# Patient Record
Sex: Male | Born: 1995 | Race: White | Hispanic: No | Marital: Single | Smoking: Never smoker
Health system: Southern US, Community
[De-identification: ages and names within clinical notes are randomized; demographics above are authoritative.]

## PROBLEM LIST (undated history)

## (undated) HISTORY — PX: WISDOM TOOTH EXTRACTION: SHX21

---

## 2017-12-12 ENCOUNTER — Emergency Department (HOSPITAL_COMMUNITY): Payer: 59

## 2017-12-12 ENCOUNTER — Encounter (HOSPITAL_COMMUNITY): Payer: Self-pay | Admitting: Emergency Medicine

## 2017-12-12 ENCOUNTER — Emergency Department (HOSPITAL_COMMUNITY)
Admission: EM | Admit: 2017-12-12 | Discharge: 2017-12-12 | Disposition: A | Discharge status: Home / Self Care | Payer: 59 | Attending: Emergency Medicine | Admitting: Emergency Medicine

## 2017-12-12 ENCOUNTER — Other Ambulatory Visit: Payer: Self-pay

## 2017-12-12 DIAGNOSIS — S0181XA Laceration without foreign body of other part of head, initial encounter: Secondary | ICD-10-CM | POA: Insufficient documentation

## 2017-12-12 DIAGNOSIS — S0990XA Unspecified injury of head, initial encounter: Secondary | ICD-10-CM | POA: Diagnosis not present

## 2017-12-12 DIAGNOSIS — Y999 Unspecified external cause status: Secondary | ICD-10-CM | POA: Diagnosis not present

## 2017-12-12 DIAGNOSIS — Y92012 Bathroom of single-family (private) house as the place of occurrence of the external cause: Secondary | ICD-10-CM | POA: Diagnosis not present

## 2017-12-12 DIAGNOSIS — Y939 Activity, unspecified: Secondary | ICD-10-CM | POA: Insufficient documentation

## 2017-12-12 DIAGNOSIS — R55 Syncope and collapse: Secondary | ICD-10-CM

## 2017-12-12 DIAGNOSIS — W1830XA Fall on same level, unspecified, initial encounter: Secondary | ICD-10-CM | POA: Insufficient documentation

## 2017-12-12 LAB — CBC WITH DIFFERENTIAL/PLATELET
Abs Immature Granulocytes: 0.04 10*3/uL (ref 0.00–0.07)
Basophils Absolute: 0 10*3/uL (ref 0.0–0.1)
Basophils Relative: 0 %
Eosinophils Absolute: 0.1 10*3/uL (ref 0.0–0.5)
Eosinophils Relative: 1 %
HCT: 43.3 % (ref 39.0–52.0)
HEMOGLOBIN: 14.2 g/dL (ref 13.0–17.0)
IMMATURE GRANULOCYTES: 1 %
LYMPHS PCT: 26 %
Lymphs Abs: 2 10*3/uL (ref 0.7–4.0)
MCH: 31.1 pg (ref 26.0–34.0)
MCHC: 32.8 g/dL (ref 30.0–36.0)
MCV: 95 fL (ref 80.0–100.0)
MONO ABS: 0.6 10*3/uL (ref 0.1–1.0)
Monocytes Relative: 7 %
NEUTROS ABS: 5.2 10*3/uL (ref 1.7–7.7)
Neutrophils Relative %: 65 %
Platelets: 251 10*3/uL (ref 150–400)
RBC: 4.56 MIL/uL (ref 4.22–5.81)
RDW: 11.5 % (ref 11.5–15.5)
WBC: 7.9 10*3/uL (ref 4.0–10.5)
nRBC: 0 % (ref 0.0–0.2)

## 2017-12-12 LAB — COMPREHENSIVE METABOLIC PANEL
ALBUMIN: 4.3 g/dL (ref 3.5–5.0)
ALT: 15 U/L (ref 0–44)
AST: 17 U/L (ref 15–41)
Alkaline Phosphatase: 42 U/L (ref 38–126)
Anion gap: 9 (ref 5–15)
BUN: 15 mg/dL (ref 6–20)
CO2: 26 mmol/L (ref 22–32)
Calcium: 9.1 mg/dL (ref 8.9–10.3)
Chloride: 108 mmol/L (ref 98–111)
Creatinine, Ser: 0.89 mg/dL (ref 0.61–1.24)
GFR calc Af Amer: >60 mL/min (ref >60)
GLUCOSE: 102 mg/dL — AB (ref 70–99)
POTASSIUM: 4.4 mmol/L (ref 3.5–5.1)
Sodium: 143 mmol/L (ref 135–145)
Total Bilirubin: 0.4 mg/dL (ref 0.3–1.2)
Total Protein: 6.7 g/dL (ref 6.5–8.1)

## 2017-12-12 LAB — RAPID URINE DRUG SCREEN, HOSP PERFORMED
AMPHETAMINES: NOT DETECTED
BENZODIAZEPINES: NOT DETECTED
Barbiturates: NOT DETECTED
COCAINE: NOT DETECTED
OPIATES: NOT DETECTED
TETRAHYDROCANNABINOL: NOT DETECTED

## 2017-12-12 LAB — ETHANOL: Alcohol, Ethyl (B): 46 mg/dL — ABNORMAL HIGH (ref <10)

## 2017-12-12 MED ORDER — SODIUM CHLORIDE 0.9 % IV BOLUS
1000.0000 mL | Freq: Once | INTRAVENOUS | Status: AC
  Administered 2017-12-12: 1000 mL via INTRAVENOUS

## 2017-12-12 MED ORDER — ONDANSETRON HCL 4 MG/2ML IJ SOLN
4.0000 mg | Freq: Once | INTRAMUSCULAR | Status: AC
  Administered 2017-12-12: 4 mg via INTRAVENOUS
  Filled 2017-12-12: qty 2

## 2017-12-12 MED ORDER — LIDOCAINE-EPINEPHRINE 2 %-1:100000 IJ SOLN
10.0000 mL | Freq: Once | INTRAMUSCULAR | Status: AC
  Administered 2017-12-12: 10 mL via INTRADERMAL
  Filled 2017-12-12: qty 10

## 2017-12-12 NOTE — Discharge Instructions (Addendum)
Recommend wound check in 2 days with your doctor. Suture removal in 3-5 days. Return to ER for any worsening or concerning symptoms. Referral to Cardiology for follow up possible syncopal episode versus vasovagal reaction.  Take Motrin or Tylenol as needed for pain. Avoid alcohol until wound is well healed.

## 2017-12-12 NOTE — ED Notes (Signed)
signature failed to capture

## 2017-12-12 NOTE — ED Triage Notes (Signed)
Pt presents with a chin laceration. Patient states that he remembers going to bed and the last thing he remembers is waking up on his bathroom floor bleeding. Patient does not remember waking up or walking to the bathroom. Patient states dad has had a similar incident.

## 2017-12-12 NOTE — ED Notes (Signed)
Patient made aware of need for urine sample. Patient unable to provide at this time.  

## 2017-12-12 NOTE — ED Notes (Signed)
ED Provider at bedside. 

## 2017-12-12 NOTE — Progress Notes (Signed)
Called regarding otherwise healthy 22 year old male patient who had alcohol use last evening.  Overnight there was a fall and chin laceration.  He does not recall the event.  He presented for wound evaluation and per the ER provider had been anesthetized and had an episode of bradycardia and presyncope.  This resolved rather quickly.  Both episodes sound like they may be vasovagal syncope.  This is much more likely in a young patient with probable degree of dehydration in the setting of alcohol use and noxious stimuli with sutures.  There also may be family history of this in his father who also had an unexplained syncopal episode.  Labs are normal except for elevated alcohol level.  Chest x-ray is unremarkable.  EKG shows lead reversal with isolated T wave inversion in lead III.  Normal axis and intervals-personally reviewed. Would recommend alcohol avoidance, hydration and follow-up with cardiology as needed if further episodes occur.  Chrystie NoseKenneth C. Kirstyn Lean, MD, [facility] Surgical CenterFACC, FACP  [place]  [facility]  Medical Director of the [facility] Diplomate of the American Board of Clinical Lipidology Attending Cardiologist  Direct Dial: [phone]  Fax: [phone]  Website:  [email or web address]

## 2017-12-12 NOTE — ED Notes (Signed)
PT STATED HE CANT VOID

## 2017-12-12 NOTE — ED Provider Notes (Signed)
[facility]-EMERGENCY DEPT Provider Note   CSN: [id number] Arrival date & time: 12/12/17  0865     History   Chief Complaint Chief Complaint  Patient presents with  . Laceration  . Fall    HPI Jarrius Huaracha is a 22 y.o. male.  22yo male presents with laceration to his chin. Patient states he vaguely remembers walking to the bathroom sometime last night, does not recall falling, woke up at 4:30AM on the floor of his bathroom with blood on his hands and noticed his chin bleeding. Patient has been ambulatory without assistance, was able to drive himself to the ER, denies headaches, nausea, vomiting, changes in vision, mouth injury or jaw pain. Reports drinking last night, states he was able to walk home. No other injuries, complaints, concerns. Last td less than 5 years ago.     History reviewed. No pertinent past medical history.  There are no active problems to display for this patient.   Past Surgical History:  Procedure Laterality Date  . WISDOM TOOTH EXTRACTION          Home Medications    Prior to Admission medications   Not on File    Family History No family history on file.  Social History Social History   Tobacco Use  . Smoking status: Never Smoker  . Smokeless tobacco: Never Used  Substance Use Topics  . Alcohol use: Yes    Comment: Social  . Drug use: Never     Allergies   Patient has no known allergies.   Review of Systems Review of Systems  Constitutional: Negative for fever.  HENT: Negative for dental problem and nosebleeds.   Eyes: Negative for visual disturbance.  Gastrointestinal: Negative for nausea and vomiting.  Musculoskeletal: Negative for arthralgias, back pain, gait problem, myalgias and neck pain.  Skin: Positive for wound.  Allergic/Immunologic: Negative for immunocompromised state.  Neurological: Negative for dizziness, speech difficulty, weakness and headaches.  Hematological: Does not  bruise/bleed easily.  Psychiatric/Behavioral: Negative for confusion.  All other systems reviewed and are negative.    Physical Exam Updated Vital Signs BP 132/63 (BP Location: Right Arm)   Pulse (!) 53   Temp 99 F (37.2 C) (Oral)   Resp 20   Ht 6' (1.829 m)   Wt 81.6 kg   SpO2 98%   BMI 24.41 kg/m   Physical Exam  Constitutional: He is oriented to person, place, and time. He appears well-developed and well-nourished. No distress.  HENT:  Head: Normocephalic. Head is without raccoon's eyes and without Battle's sign.    Nose: Nose normal.  Mouth/Throat: Uvula is midline and oropharynx is clear and moist. Normal dentition. No lacerations. No oropharyngeal exudate.  Eyes: Pupils are equal, round, and reactive to light. Conjunctivae and EOM are normal.  Neck: Normal range of motion. Neck supple.  Cardiovascular: Normal rate, regular rhythm, normal heart sounds and intact distal pulses.  No murmur heard. Pulmonary/Chest: Effort normal and breath sounds normal. No respiratory distress.  Musculoskeletal: He exhibits no tenderness or deformity.  Neurological: He is alert and oriented to person, place, and time. He is not disoriented. No cranial nerve deficit or sensory deficit. He exhibits normal muscle tone. Coordination normal. GCS eye subscore is 4. GCS verbal subscore is 5. GCS motor subscore is 6.  Skin: Skin is warm and dry. He is not diaphoretic.  Psychiatric: He has a normal mood and affect. His behavior is normal.  Nursing note and vitals reviewed.  ED Treatments / Results  Labs (all labs ordered are listed, but only abnormal results are displayed) Labs Reviewed  COMPREHENSIVE METABOLIC PANEL - Abnormal; Notable for the following components:      Result Value   Glucose, Bld 102 (*)    All other components within normal limits  ETHANOL - Abnormal; Notable for the following components:   Alcohol, Ethyl (B) 46 (*)    All other components within normal limits  CBC  WITH DIFFERENTIAL/PLATELET  RAPID URINE DRUG SCREEN, HOSP PERFORMED    EKG None  Radiology Ct Head Wo Contrast  Result Date: 12/12/2017 CLINICAL DATA:  23 year old male with acute headache following head injury. Initial encounter. EXAM: CT HEAD WITHOUT CONTRAST TECHNIQUE: Contiguous axial images were obtained from the base of the skull through the vertex without intravenous contrast. COMPARISON:  None. FINDINGS: Brain: No evidence of acute infarction, hemorrhage, hydrocephalus, extra-axial collection or mass lesion/mass effect. Cavum septum pellucidum noted. Vascular: No hyperdense vessel or unexpected calcification. Skull: Normal. Negative for fracture or focal lesion. Sinuses/Orbits: No acute finding. Other: None. IMPRESSION: No evidence of acute intracranial abnormality. Cavum septum pellucidum Electronically Signed   By: Harmon Pier M.D.   On: 12/12/2017 09:03   Ct Cervical Spine Wo Contrast  Result Date: 12/12/2017 CLINICAL DATA:  Fall, syncope, trauma EXAM: CT CERVICAL SPINE WITHOUT CONTRAST TECHNIQUE: Multidetector CT imaging of the cervical spine was performed without intravenous contrast. Multiplanar CT image reconstructions were also generated. COMPARISON:  None. FINDINGS: Alignment: Straightened alignment may be positional. No subluxation dislocation. Skull base and vertebrae: No acute fracture. No primary bone lesion or focal pathologic process. Soft tissues and spinal canal: No prevertebral fluid or swelling. No visible canal hematoma. Disc levels: Preserved vertebral body heights and disc spaces. No significant degenerative process. Facets aligned. No subluxation dislocation. Intact odontoid. Upper chest: Negative. Other: None. IMPRESSION: Straightened cervical spine alignment may be positional. No acute osseous finding, fracture, or malalignment by CT. Electronically Signed   By: Judie Petit.  Shick M.D.   On: 12/12/2017 09:50   Dg Chest Port 1 View  Result Date: 12/12/2017 CLINICAL DATA:   Syncope EXAM: PORTABLE CHEST 1 VIEW COMPARISON:  None. FINDINGS: The heart size and mediastinal contours are within normal limits. Both lungs are clear. The visualized skeletal structures are unremarkable. IMPRESSION: No active disease. Electronically Signed   By: Judie Petit.  Shick M.D.   On: 12/12/2017 09:13    Procedures .Marland KitchenLaceration Repair Date/Time: 12/12/2017 6:56 AM Performed by: Jeannie Fend, PA-C Authorized by: Jeannie Fend, PA-C   Consent:    Consent obtained:  Verbal   Consent given by:  Patient   Risks discussed:  Infection, need for additional repair, pain, poor cosmetic result and poor wound healing   Alternatives discussed:  No treatment and delayed treatment Universal protocol:    Procedure explained and questions answered to patient or proxy's satisfaction: yes     Relevant documents present and verified: yes     Immediately prior to procedure, a time out was called: yes     Patient identity confirmed:  Verbally with patient Anesthesia (see MAR for exact dosages):    Anesthesia method:  Local infiltration   Local anesthetic:  Lidocaine 2% WITH epi Laceration details:    Location:  Face   Face location:  Chin   Length (cm):  2.5   Depth (mm):  4 Repair type:    Repair type:  Simple Pre-procedure details:    Preparation:  Patient was prepped and draped in  usual sterile fashion Exploration:    Hemostasis achieved with:  Epinephrine   Wound exploration: wound explored through full range of motion and entire depth of wound probed and visualized     Wound extent: no foreign bodies/material noted and no muscle damage noted     Contaminated: no   Treatment:    Area cleansed with:  Saline   Amount of cleaning:  Standard   Irrigation solution:  Sterile saline Skin repair:    Repair method:  Sutures   Suture size:  5-0   Suture material:  Prolene   Suture technique:  Simple interrupted   Number of sutures:  4 Approximation:    Approximation:  Close Post-procedure  details:    Dressing:  Open (no dressing)   Patient tolerance of procedure:  Tolerated well, no immediate complications   (including critical care time)  Medications Ordered in ED Medications  lidocaine-EPINEPHrine (XYLOCAINE W/EPI) 2 %-1:100000 (with pres) injection 10 mL (10 mLs Intradermal Given by Other 12/12/17 0716)  ondansetron (ZOFRAN) injection 4 mg (4 mg Intravenous Given 12/12/17 0830)  sodium chloride 0.9 % bolus 1,000 mL (0 mLs Intravenous Stopped 12/12/17 1029)     Initial Impression / Assessment and Plan / ED Course  I have reviewed the triage vital signs and the nursing notes.  Pertinent labs & imaging results that were available during my care of the patient were reviewed by me and considered in my medical decision making (see chart for details).  Clinical Course as of Dec 13 1346  Sun Dec 12, 2017  [id number] 22 year old male presents with laceration to his chin.  States that he woke up at around times a night to go to the bathroom and the next thing he remembers is waking up on the floor with blood on his hands and noticing he had a cut to his chin which was still bleeding. On initial exam, denied headache, nausea/vomiting, states he was otherwise feeling well and drove himself to the Er. Laceration to the chin, no neck pain pain, no other head trauma, no other injuries. Returned to room for laceration repair, patient stated he was not feeling well, appeared slightly flushed, placed in the supine position for lac repair. Anaesthetized with lido with epi, patient reported feeling nauseous, HR noted to be in the 30s, assisted to seated position and patient vomited with increase in HR to the 90s. EKG, CT head/neck, labs ordered for further evaluation for concern for syncope vs arrhythmia vs vasovagal reaction vs intracranial injury. Case discussed with Dr. Freida BusmanAllen, ER attending. Imaging normal, labs with elevated alchol at 46 (5 hours after patient reported waking on the bathroom floor  at 4:30AM). No cardiac history, states his father had an episode about 10 years ago when he passed out in the bathroom and cut his lip, no known cardiac diagnosis after that event.  Case discussed with Dr. Rennis GoldenHilty with cardiology, likely vasovagal episode and does  not require further monitoring or admission. Dr. Freida BusmanAllen has seen the patient, no further symptoms while in the Er, patient does not want to be admitted at this time. Patient referred to cardiology for followup with strict return to ER precautions given.    [LM]    Clinical Course User Index [LM] Jeannie FendMurphy, Olvin Rohr A, PA-C   Final Clinical Impressions(s) / ED Diagnoses   Final diagnoses:  Chin laceration, initial encounter  Injury of head, initial encounter  Vasovagal episode    ED Discharge Orders    None  Jeannie Fend, PA-C 12/12/17 1348    Lorre Nick, MD 12/13/17 [phone]

## 2019-05-29 ENCOUNTER — Other Ambulatory Visit: Payer: Self-pay

## 2019-05-29 MED ORDER — MELOXICAM 15 MG PO TABS: ORAL_TABLET | ORAL | 1 refills | Status: AC

## 2019-05-29 NOTE — Progress Notes (Signed)
Submitted mobic 15mg  1 po daily #30 with 1 rf per Dr request to Alfonso Patten

## 2019-09-19 ENCOUNTER — Ambulatory Visit (INDEPENDENT_AMBULATORY_CARE_PROVIDER_SITE_OTHER): Payer: Self-pay | Admitting: Orthopedic Surgery

## 2019-09-19 DIAGNOSIS — M25562 Pain in left knee: Secondary | ICD-10-CM

## 2019-09-27 ENCOUNTER — Other Ambulatory Visit: Payer: Self-pay

## 2019-09-30 ENCOUNTER — Encounter: Payer: Self-pay | Admitting: Orthopedic Surgery

## 2019-09-30 NOTE — Progress Notes (Signed)
° °  Post-Op Visit Note   Patient: Louis Marshall           Date of Birth: 04-Oct-1995           MRN: [id number] Visit Date: 09/19/2019 PCP: System, Pcp Not In   Assessment & Plan:  Chief Complaint: No chief complaint on file.  Visit Diagnoses:  1. Acute pain of left knee     Plan: Louis Marshall is a patient with left knee pain.  He plays baseball.  Tweaked his knee the summer.  Hard for him to go up and down stairs and it is hard for him to run.  Localizes the pain in the patellar tendon region.  He has tried naproxen and stretching modalities as well as dry needling.  On exam he has focal tenderness over the left patellar tendon right at the inferior pole.  No effusion.  Collateral and cruciate ligaments are stable.  Extensor mechanism is intact.  Pedal pulses palpable.  Fluoroscopic x-ray of the knee unremarkable and negative for fracture or arthritis.  Impression is patellar tendinitis.  We will try him on a Medrol Dosepak as well as specialized iontophoresis with Louis Marshall.  See him back as needed.  Follow-Up Instructions: Return if symptoms worsen or fail to improve.   Orders:  No orders of the defined types were placed in this encounter.  No orders of the defined types were placed in this encounter.   Imaging: No results found.  PMFS History: There are no problems to display for this patient.  History reviewed. No pertinent past medical history.  History reviewed. No pertinent family history.  Past Surgical History:  Procedure Laterality Date   WISDOM TOOTH EXTRACTION     Social History   Occupational History   Not on file  Tobacco Use   Smoking status: Never Smoker   Smokeless tobacco: Never Used  Substance and Sexual Activity   Alcohol use: Yes    Comment: Social   Drug use: Never   Sexual activity: Not on file

## 2020-02-05 ENCOUNTER — Ambulatory Visit: Payer: Self-pay

## 2020-03-25 ENCOUNTER — Ambulatory Visit (INDEPENDENT_AMBULATORY_CARE_PROVIDER_SITE_OTHER): Payer: Self-pay | Admitting: Orthopedic Surgery

## 2020-03-25 DIAGNOSIS — M25511 Pain in right shoulder: Secondary | ICD-10-CM

## 2020-03-27 ENCOUNTER — Encounter: Payer: Self-pay | Admitting: Orthopedic Surgery

## 2020-03-27 NOTE — Progress Notes (Signed)
   Post-Op Visit Note   Patient: Louis Marshall           Date of Birth: 06-26-1995           MRN: [id number] Visit Date: 03/25/2020 PCP: Pcp, No   Assessment & Plan:  Chief Complaint: No chief complaint on file.  Visit Diagnoses:  1. Acute pain of right shoulder     Plan: Louis Marshall is a 24 year old patient with right shoulder pain which she has had for "a while".  Reports posterior pain.  This fades away over time.  He is able to pitch for 5 endings and then the shoulder gets tight and it is hard for him to keep up his velocity.  He did some rehabilitation starting winter break.  Takes Aleve for his symptoms.  Hurts for him to turn the wheel.  His pain is most severe in the release and follow through phase of throwing.  Denies any new mechanical symptoms.  Since onset of symptoms his throwing velocity has decreased from the 89-91 range down to the 87-88 range in the middle of the game and by the end of the game 84-85.  Denies any neck pain or numbness and tingling.  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  He states that his problem feels more muscular in nature.  On exam his got full active and passive range of motion of the shoulder.  Motor sensory function is intact in the hand.  Negative apprehension relocation testing.  Equivocal O'Brien's testing on the right negative on the left.  No discrete AC joint tenderness is present.  Impression is right shoulder pain which could be rotator cuff tendinitis/overuse versus labral pathology.  Neck step would be Medrol Dosepak and consideration of injection.  He may need further work-up at the end of the season as well with MRI arthrogram if mechanical symptoms began.  Follow-Up Instructions: Return if symptoms worsen or fail to improve.   Orders:  No orders of the defined types were placed in this encounter.  No orders of the defined types were placed in this encounter.   Imaging: No results found.  PMFS History: There are  no problems to display for this patient.  History reviewed. No pertinent past medical history.  History reviewed. No pertinent family history.  Past Surgical History:  Procedure Laterality Date  . WISDOM TOOTH EXTRACTION     Social History   Occupational History  . Not on file  Tobacco Use  . Smoking status: Never Smoker  . Smokeless tobacco: Never Used  Substance and Sexual Activity  . Alcohol use: Yes    Comment: Social  . Drug use: Never  . Sexual activity: Not on file

## 2020-06-12 IMAGING — CT CT HEAD W/O CM
3 series · 14 of 47 positions shown, 16 images · non-contrast
Comparison: None.

CLINICAL DATA: 22-year-old male with acute headache following head
injury. Initial encounter.

EXAM:
CT HEAD WITHOUT CONTRAST
TECHNIQUE: Contiguous axial images were obtained from the base of the skull
through the vertex without intravenous contrast.

[Series 2: head wo · axial · 0.47mm/px · z∈[-112,+13]mm · 8 of 31 slices shown, 10 images]
[im 3/31  brain]
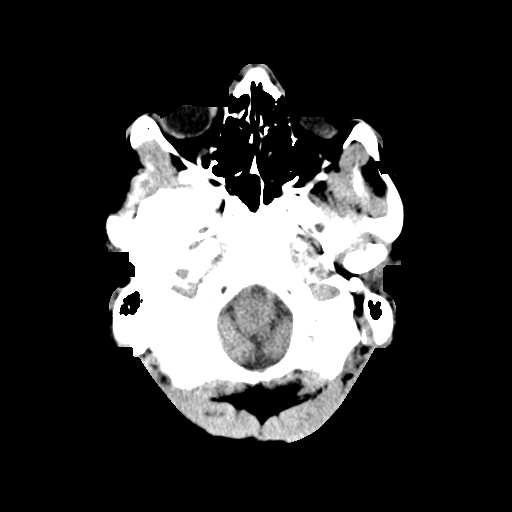
[im 3/31  bone]
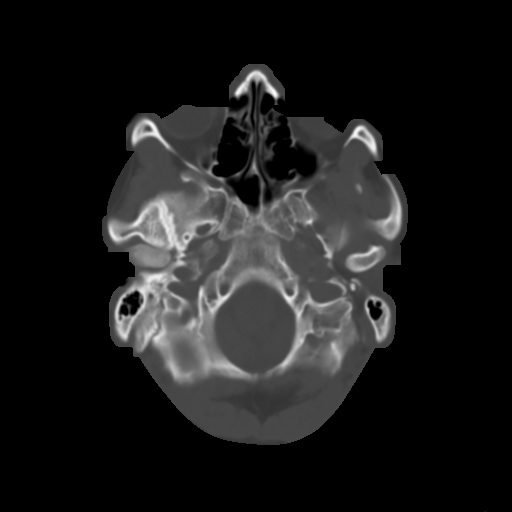
[im 7/31  brain]
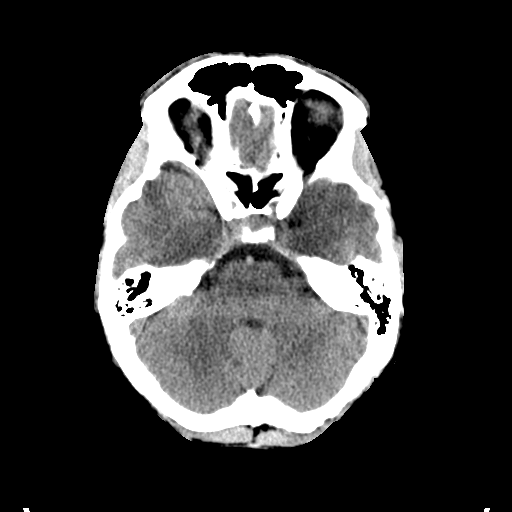
[im 10/31  brain]
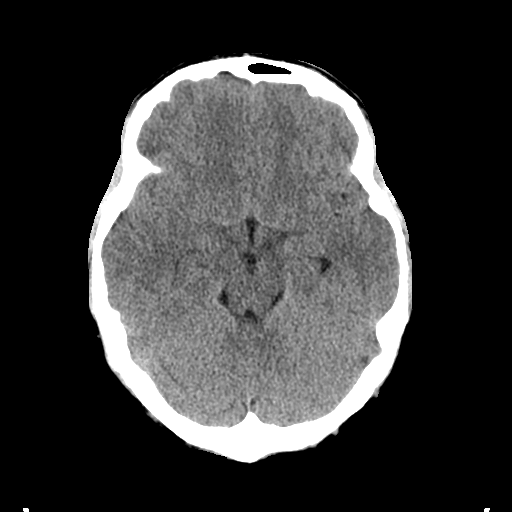
[im 14/31  brain]
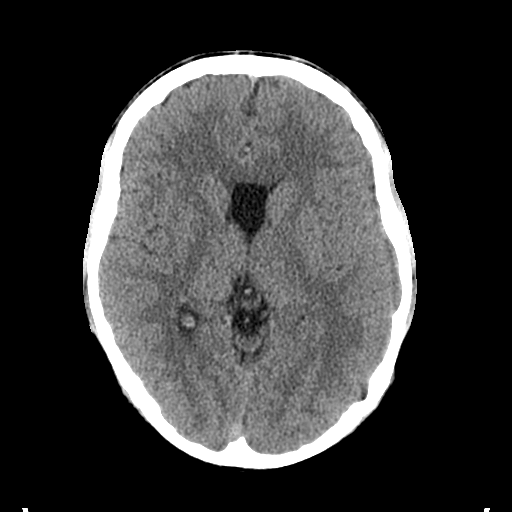
[im 17/31  brain]
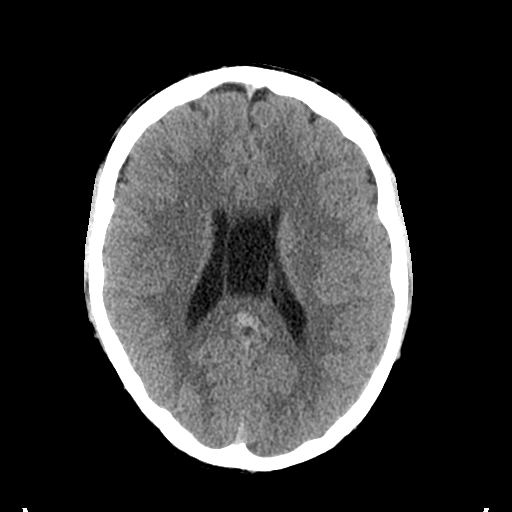
[im 17/31  bone]
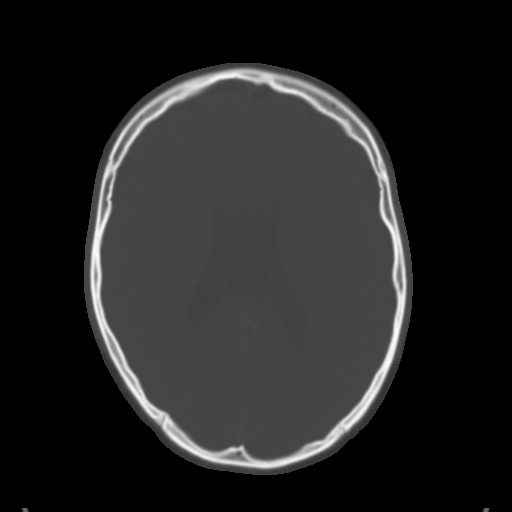
[im 21/31  brain]
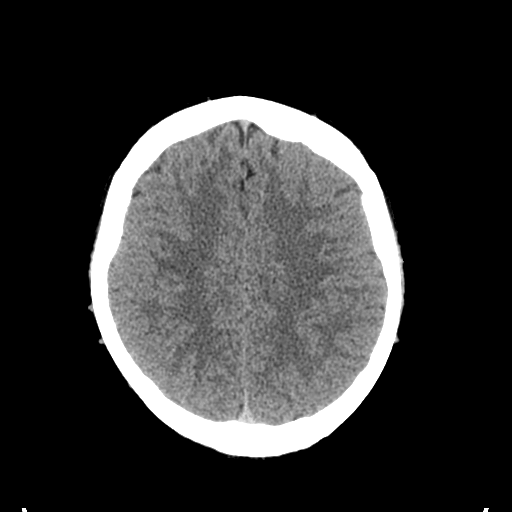
[im 24/31  brain]
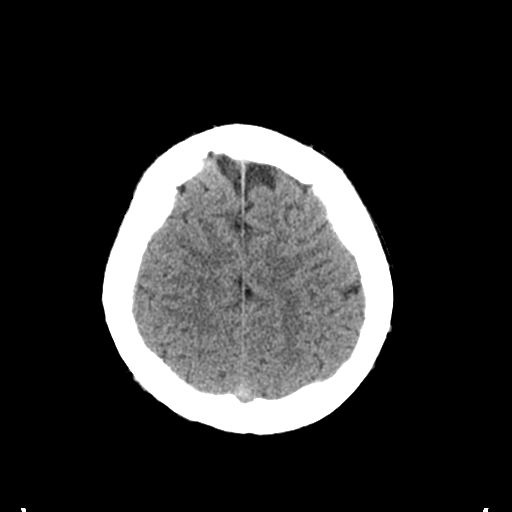
[im 28/31  brain]
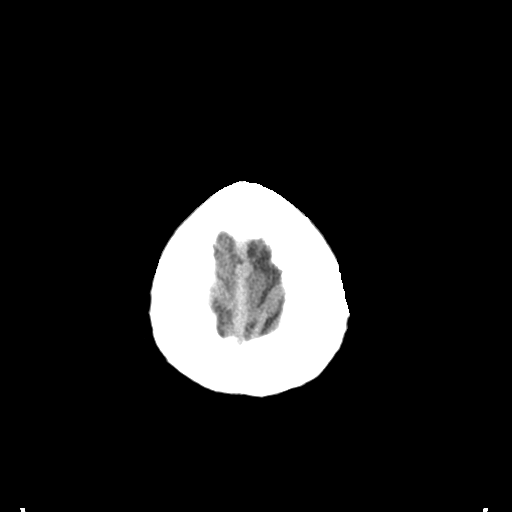

[Series 4: coronal soft tissue · coronal · 0.37mm/px · 3 of 68 slices shown]
[im 23/68  brain]
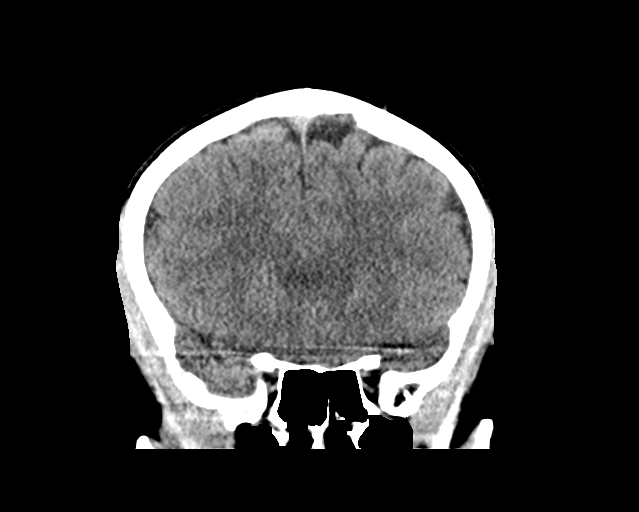
[im 30/68  brain]
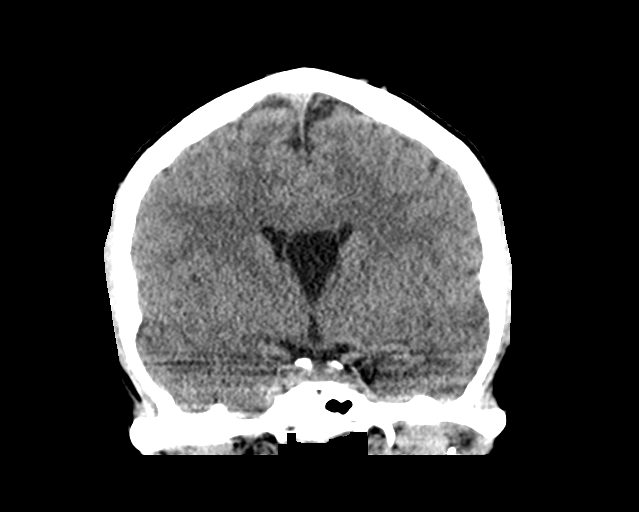
[im 38/68  brain]
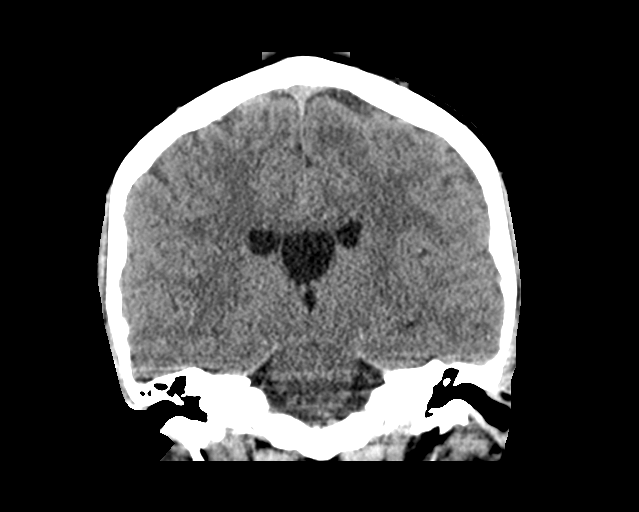

[Series 5: sagittal soft tissue · sagittal · 0.37mm/px · 3 of 54 slices shown]
[im 18/54  brain]
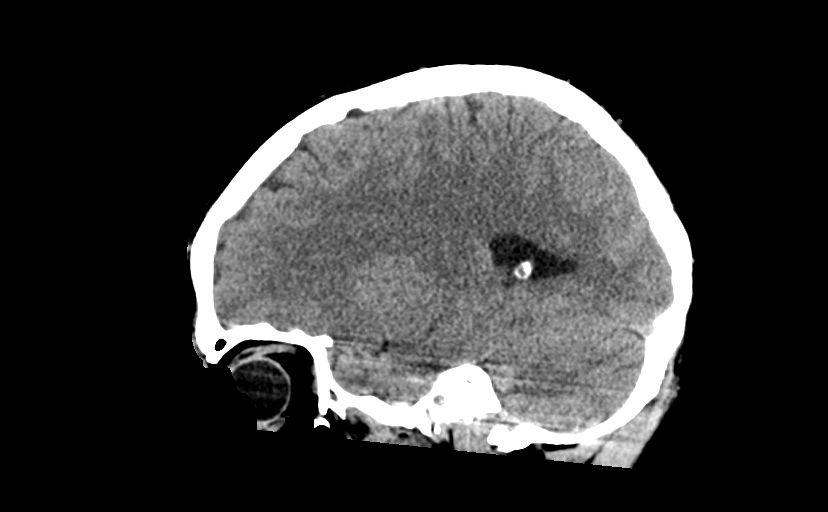
[im 27/54  brain]
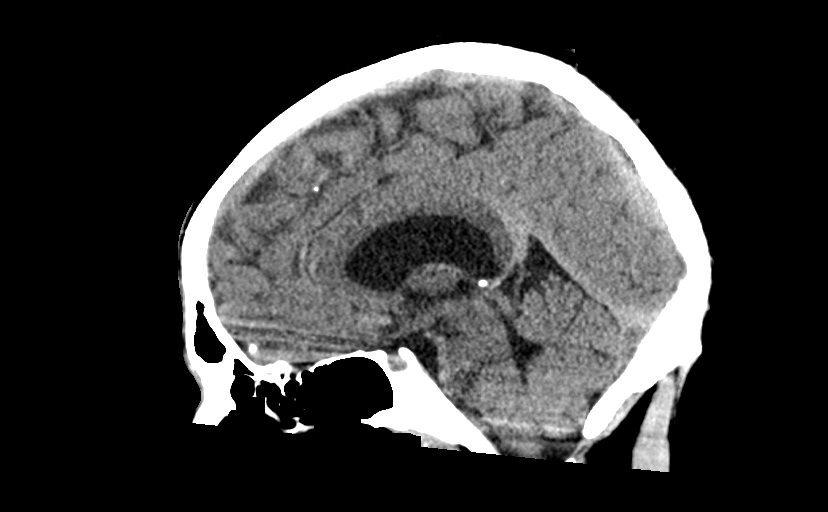
[im 36/54  brain]
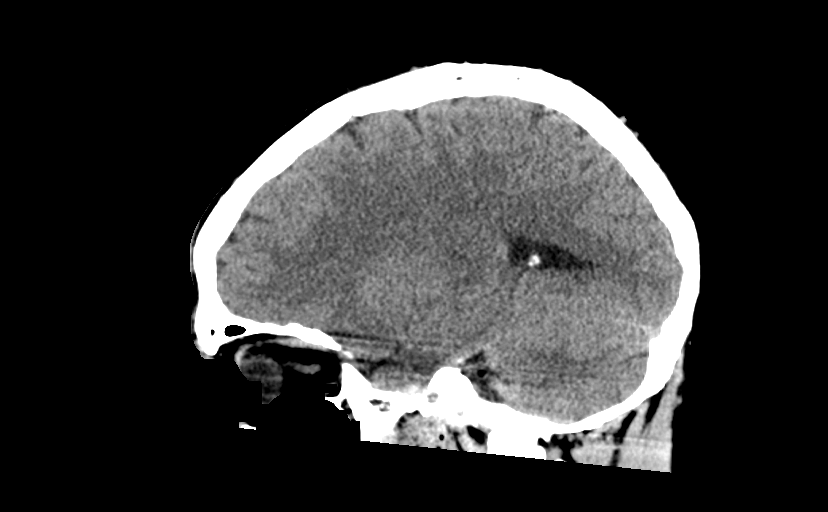

[14 of 47 positions shown; findings below may reference images not displayed]

FINDINGS: Brain: No evidence of acute infarction, hemorrhage, hydrocephalus,
extra-axial collection or mass lesion/mass effect.

Cavum septum pellucidum noted.

Vascular: No hyperdense vessel or unexpected calcification.

Skull: Normal. Negative for fracture or focal lesion.

Sinuses/Orbits: No acute finding.

Other: None.
IMPRESSION: No evidence of acute intracranial abnormality.

Cavum septum pellucidum
